# Patient Record
Sex: Male | Born: 1950 | Race: White | Hispanic: No | Marital: Married
Health system: Midwestern US, Academic
[De-identification: ages and names within clinical notes are randomized; demographics above are authoritative.]

---

## 2016-12-16 ENCOUNTER — Ambulatory Visit: Admit: 2016-12-16 | Discharge: 2016-12-17 | Payer: MEDICARE

## 2016-12-16 ENCOUNTER — Encounter: Admit: 2016-12-16 | Discharge: 2016-12-16

## 2016-12-16 DIAGNOSIS — R002 Palpitations: Principal | ICD-10-CM

## 2016-12-16 DIAGNOSIS — R091 Pleurisy: ICD-10-CM

## 2016-12-16 DIAGNOSIS — E7800 Pure hypercholesterolemia, unspecified: ICD-10-CM

## 2016-12-16 DIAGNOSIS — L719 Rosacea, unspecified: ICD-10-CM

## 2016-12-16 DIAGNOSIS — E785 Hyperlipidemia, unspecified: ICD-10-CM

## 2016-12-16 DIAGNOSIS — M791 Myalgia, unspecified site: ICD-10-CM

## 2016-12-16 DIAGNOSIS — J069 Acute upper respiratory infection, unspecified: ICD-10-CM

## 2016-12-16 NOTE — Progress Notes
Date of Service: 12/16/2016    Bryan Petty is a 66 y.o. male.       HPI     Mr. Bryan Petty is followed for palpitations and for hyperlipidemia. At one time he was taking Toprol XL for control of his palpitations, although in recent years his palpitations have been under good control and he has stopped taking his Toprol XL. The patient has a family history for premature coronary artery disease, in that his father had his first myocardial infarction at age 72. He has been taking simvastatin 20 mg every day without difficulty. Previously when he was taking atorvastatin, he developed some mild myalgias, although he has not developed myalgias with simvastatin. Over the past year, the patient has been doing well and he reports no angina, congestive symptoms, palpitations, sensation of sustained forceful heart pounding, lightheadedness or syncope. His exercise tolerance has been stable, and he has been exercising using a stationary bicycle for 30 minutes 3-4 days a week. 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  The patient reports no myalgias, bleeding abnormalities, neurologic motor abnormalities or difficulty with speech.        Vitals:    12/16/16 1523 12/16/16 1536   BP: 128/86 120/86   Pulse: 68    Weight: 100.3 kg (221 lb 3.2 oz)    Height: 1.803 m (5' 11)      Body mass index is 30.85 kg/m???.     Past Medical History  Patient Active Problem List    Diagnosis Date Noted   ??? Heart palpitations 04/16/2009     under good control with Toprol     ??? 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 Myalgia 04/16/2009     Developed some mild myalgias and reduced his Lipitor from 20 to 10 mg per day with resolution of his  symptoms         ??? Upper respiratory infection 04/16/2009   ??? Pleurisy hx of 04/16/2009   ??? Rosacea 04/16/2009         Review of Systems   Constitution: Negative.   HENT: Negative.    Eyes: Negative.    Cardiovascular: Negative. Respiratory: Negative.    Endocrine: Negative.    Hematologic/Lymphatic: Negative.    Skin: Negative.    Musculoskeletal: Negative.    Gastrointestinal: Negative.    Genitourinary: Negative.    Neurological: Negative.    Psychiatric/Behavioral: Negative.    Allergic/Immunologic: Negative.        Physical Exam  GENERAL: The patient is well developed, well nourished, resting comfortably and in no distress.   HEENT: No abnormalities of the visible oro-nasopharynx, conjunctiva or sclera are noted.  NECK: There is no jugular venous distension. Carotids are palpable and without bruits. There is no thyroid enlargement.  Chest: Lung fields are clear to auscultation. There are no wheezes or crackles.  CV: There is a regular rhythm. The first and second heart sounds are normal. There are no murmurs, gallops or rubs.  His apical heart rate is 68 bpm.  ABD: The abdomen is soft and supple with normal bowel sounds. There is no hepatosplenomegaly, ascites, tenderness, masses or bruits.  Neuro: There are no focal motor defects. Ambulation is normal. Cognitive function appears normal.  Ext: There is no edema or evidence of deep vein thrombosis. Peripheral pulses are satisfactory.    SKIN: There are no rashes and no cellulitis  PSYCH: The patient is calm, rationale  and oriented    Cardiovascular Studies  A twelve-lead ECG was obtained on 12/16/2016 and shows normal sinus rhythm with a heart rate of 60 bpm.  There is borderline left atrial abnormality.  There is no evidence of myocardial ischemia or infarction.    Labs obtained on 11/04/2016 showed total cholesterol 125, triglycerides 89, HDL 46 and LDL 68 mg/dL.  Problems Addressed Today  Hypercholesterolemia.  Palpitations.  Assessment and Plan     [name] is doing well from a cardiovascular perspective. None of his medications were changed today. He reports no angina or palpitations and his blood pressure and lipid profile are under very good control. Regular mild, aerobic exercise, weight loss and adherence to a heart healthy diet were recommended. I have asked him to return for follow-up in 12 months.         Current Medications (including today's revisions)  ??? aspirin EC 81 mg tablet Take 81 mg by mouth daily. Take with food.   ??? metoprolol XL (TOPROL XL) 25 mg extended release tablet Take 1 Tab by mouth daily. (Patient taking differently: Take 25 mg by mouth as Needed.)   ??? simvastatin (ZOCOR) 20 mg tablet Take 1 tablet by mouth at bedtime daily.

## 2016-12-19 ENCOUNTER — Encounter: Admit: 2016-12-19 | Discharge: 2016-12-19

## 2017-08-14 ENCOUNTER — Encounter: Admit: 2017-08-14 | Discharge: 2017-08-14

## 2017-08-27 ENCOUNTER — Encounter: Admit: 2017-08-27 | Discharge: 2017-08-27

## 2017-08-27 ENCOUNTER — Ambulatory Visit: Admit: 2017-08-27 | Discharge: 2017-08-28 | Payer: MEDICARE

## 2017-08-27 DIAGNOSIS — R091 Pleurisy: ICD-10-CM

## 2017-08-27 DIAGNOSIS — E785 Hyperlipidemia, unspecified: ICD-10-CM

## 2017-08-27 DIAGNOSIS — R002 Palpitations: Principal | ICD-10-CM

## 2017-08-27 DIAGNOSIS — J069 Acute upper respiratory infection, unspecified: ICD-10-CM

## 2017-08-27 DIAGNOSIS — E7800 Pure hypercholesterolemia, unspecified: ICD-10-CM

## 2017-08-27 DIAGNOSIS — M791 Myalgia, unspecified site: ICD-10-CM

## 2017-08-27 DIAGNOSIS — L719 Rosacea, unspecified: ICD-10-CM

## 2017-09-04 ENCOUNTER — Ambulatory Visit: Admit: 2017-09-04 | Discharge: 2017-09-05 | Payer: MEDICARE

## 2017-09-04 ENCOUNTER — Ambulatory Visit: Admit: 2017-09-04 | Discharge: 2017-09-04 | Payer: MEDICARE

## 2017-09-04 DIAGNOSIS — R002 Palpitations: Principal | ICD-10-CM

## 2017-09-04 MED ORDER — PERFLUTREN LIPID MICROSPHERES 1.1 MG/ML IV SUSP
1-20 mL | Freq: Once | INTRAVENOUS | 0 refills | Status: CP | PRN
Start: 2017-09-04 — End: ?

## 2017-09-07 ENCOUNTER — Encounter: Admit: 2017-09-07 | Discharge: 2017-09-07

## 2017-09-07 DIAGNOSIS — R9439 Abnormal result of other cardiovascular function study: Principal | ICD-10-CM

## 2017-09-07 DIAGNOSIS — R002 Palpitations: Principal | ICD-10-CM

## 2017-09-07 MED ORDER — LIDOCAINE (PF) 10 MG/ML (1 %) IJ SOLN
.1-2 mL | INTRAMUSCULAR | 0 refills | Status: CN | PRN
Start: 2017-09-07 — End: ?

## 2017-09-07 MED ORDER — SODIUM CHLORIDE 0.9 % IV SOLP
250 mL | INTRAVENOUS | 0 refills | Status: CN | PRN
Start: 2017-09-07 — End: ?

## 2017-09-08 ENCOUNTER — Encounter: Admit: 2017-09-08 | Discharge: 2017-09-08

## 2017-09-08 ENCOUNTER — Encounter: Admit: 2017-09-16 | Discharge: 2017-09-16 | Payer: MEDICARE

## 2017-09-08 DIAGNOSIS — R9439 Abnormal result of other cardiovascular function study: Principal | ICD-10-CM

## 2017-09-11 ENCOUNTER — Encounter: Admit: 2017-09-11 | Discharge: 2017-09-11

## 2017-09-11 DIAGNOSIS — R9439 Abnormal result of other cardiovascular function study: Principal | ICD-10-CM

## 2017-09-11 MED ORDER — MAGNESIUM HYDROXIDE 2,400 MG/10 ML PO SUSP
10 mL | ORAL | 0 refills | Status: CN | PRN
Start: 2017-09-11 — End: ?

## 2017-09-11 MED ORDER — ACETAMINOPHEN 325 MG PO TAB
650 mg | ORAL | 0 refills | Status: CN | PRN
Start: 2017-09-11 — End: ?

## 2017-09-11 MED ORDER — TEMAZEPAM 15 MG PO CAP
15 mg | Freq: Every evening | ORAL | 0 refills | Status: CN | PRN
Start: 2017-09-11 — End: ?

## 2017-09-11 MED ORDER — ALUMINUM-MAGNESIUM HYDROXIDE 200-200 MG/5 ML PO SUSP
30 mL | ORAL | 0 refills | Status: CN | PRN
Start: 2017-09-11 — End: ?

## 2017-09-12 LAB — BASIC METABOLIC PANEL
Lab: 1
Lab: 109 — ABNORMAL HIGH (ref 98–107)
Lab: 12
Lab: 141
Lab: 16 — ABNORMAL HIGH (ref 27.0–31.0)
Lab: 24
Lab: 4.3
Lab: 9.5
Lab: 99

## 2017-09-12 LAB — CBC: Lab: 5.2

## 2017-09-15 ENCOUNTER — Encounter: Admit: 2017-09-15 | Discharge: 2017-09-15

## 2017-09-15 DIAGNOSIS — R002 Palpitations: Principal | ICD-10-CM

## 2017-09-15 DIAGNOSIS — R9439 Abnormal result of other cardiovascular function study: ICD-10-CM

## 2017-09-16 ENCOUNTER — Ambulatory Visit: Admit: 2017-09-16 | Discharge: 2017-09-16 | Payer: MEDICARE

## 2017-09-16 ENCOUNTER — Encounter: Admit: 2017-09-16 | Discharge: 2017-09-16

## 2017-09-16 DIAGNOSIS — I2510 Atherosclerotic heart disease of native coronary artery without angina pectoris: ICD-10-CM

## 2017-09-16 DIAGNOSIS — I4510 Unspecified right bundle-branch block: ICD-10-CM

## 2017-09-16 DIAGNOSIS — E782 Mixed hyperlipidemia: ICD-10-CM

## 2017-09-16 DIAGNOSIS — I2583 Coronary atherosclerosis due to lipid rich plaque: ICD-10-CM

## 2017-09-16 DIAGNOSIS — M791 Myalgia, unspecified site: ICD-10-CM

## 2017-09-16 DIAGNOSIS — J069 Acute upper respiratory infection, unspecified: ICD-10-CM

## 2017-09-16 DIAGNOSIS — R091 Pleurisy: ICD-10-CM

## 2017-09-16 DIAGNOSIS — R002 Palpitations: Principal | ICD-10-CM

## 2017-09-16 DIAGNOSIS — L719 Rosacea, unspecified: ICD-10-CM

## 2017-09-16 DIAGNOSIS — E785 Hyperlipidemia, unspecified: ICD-10-CM

## 2017-09-16 MED ORDER — ALUMINUM-MAGNESIUM HYDROXIDE 200-200 MG/5 ML PO SUSP
30 mL | ORAL | 0 refills | Status: DC | PRN
Start: 2017-09-16 — End: 2017-09-16

## 2017-09-16 MED ORDER — LIDOCAINE (PF) 10 MG/ML (1 %) IJ SOLN
.1-2 mL | INTRAMUSCULAR | 0 refills | Status: DC | PRN
Start: 2017-09-16 — End: 2017-09-16

## 2017-09-16 MED ORDER — DIPHENHYDRAMINE HCL 50 MG/ML IJ SOLN
25 mg | INTRAVENOUS | 0 refills | Status: DC | PRN
Start: 2017-09-16 — End: 2017-09-16

## 2017-09-16 MED ORDER — SODIUM CHLORIDE 0.9 % IV SOLP
250 mL | INTRAVENOUS | 0 refills | Status: DC | PRN
Start: 2017-09-16 — End: 2017-09-16

## 2017-09-16 MED ORDER — MAGNESIUM HYDROXIDE 2,400 MG/10 ML PO SUSP
10 mL | ORAL | 0 refills | Status: DC | PRN
Start: 2017-09-16 — End: 2017-09-16

## 2017-09-16 MED ORDER — DIPHENHYDRAMINE HCL 25 MG PO CAP
25 mg | ORAL | 0 refills | Status: DC | PRN
Start: 2017-09-16 — End: 2017-09-16

## 2017-09-16 MED ORDER — ACETAMINOPHEN 325 MG PO TAB
650 mg | ORAL | 0 refills | Status: DC | PRN
Start: 2017-09-16 — End: 2017-09-16

## 2017-09-16 MED ORDER — TEMAZEPAM 15 MG PO CAP
15 mg | Freq: Every evening | ORAL | 0 refills | Status: DC | PRN
Start: 2017-09-16 — End: 2017-09-16

## 2017-09-16 MED ORDER — ONDANSETRON HCL (PF) 4 MG/2 ML IJ SOLN
4 mg | INTRAVENOUS | 0 refills | Status: DC | PRN
Start: 2017-09-16 — End: 2017-09-16

## 2017-09-22 ENCOUNTER — Encounter: Admit: 2017-09-22 | Discharge: 2017-09-22

## 2017-11-05 ENCOUNTER — Encounter: Admit: 2017-11-05 | Discharge: 2017-11-05

## 2017-11-05 DIAGNOSIS — E782 Mixed hyperlipidemia: Principal | ICD-10-CM

## 2017-11-05 DIAGNOSIS — I2510 Atherosclerotic heart disease of native coronary artery without angina pectoris: ICD-10-CM

## 2019-10-23 IMAGING — MR Shoulder^ROUTINE
5 of 7 series · 26 of 40 positions shown · non-contrast
Comparison: none

[Series 4: PD · oblique · 4.0mm · 0.55mm/px · 6 of 18 slices shown]
[im 1/18]
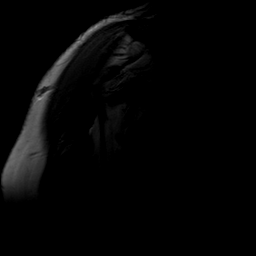
[im 4/18]
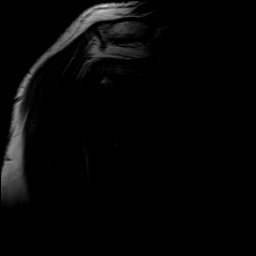
[im 7/18]
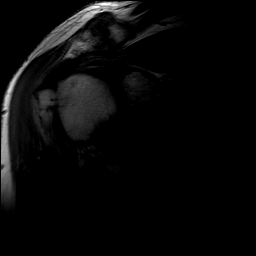
[im 11/18]
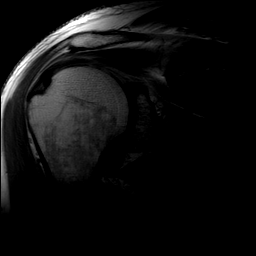
[im 14/18]
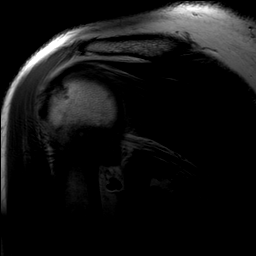
[im 18/18]
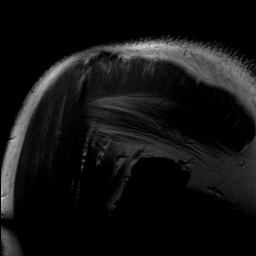

[Series 5: T2 fat-sat · oblique · 4.0mm · 0.27mm/px · 5 of 18 slices shown (1 of 2)]
[im 1/18]
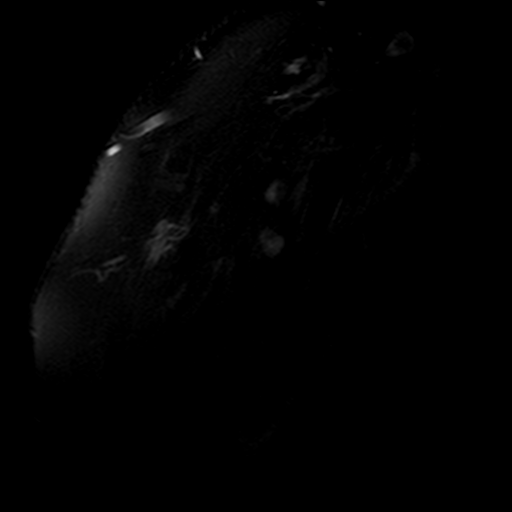
[im 5/18]
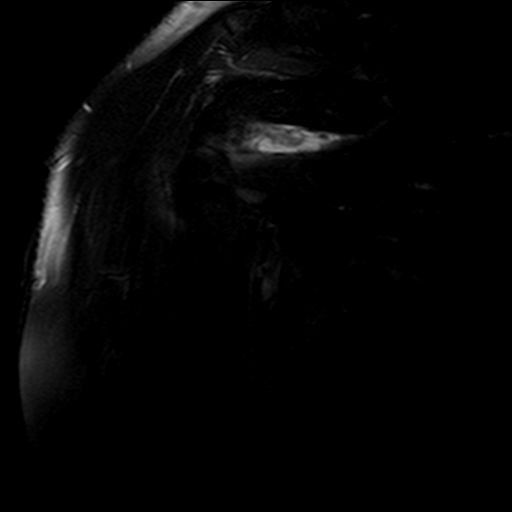
[im 9/18]
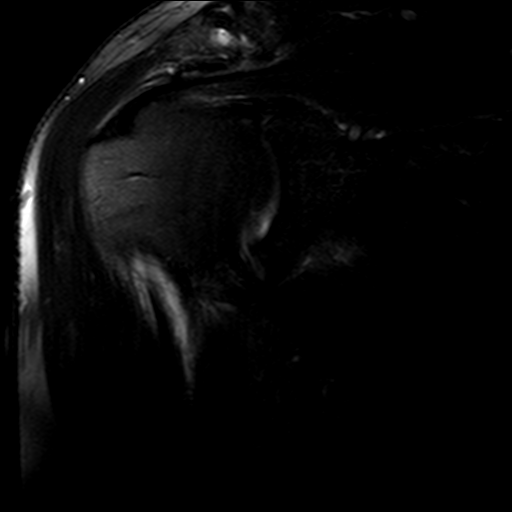
[im 13/18]
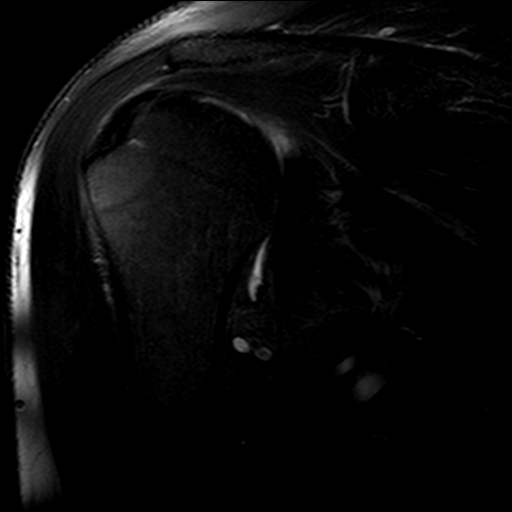
[im 18/18]
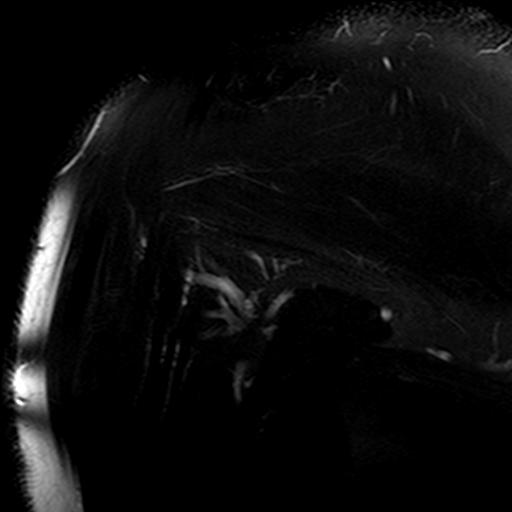

[Series 6: T1 · oblique · 4.0mm · 0.44mm/px · 6 of 22 slices shown]
[im 1/22]
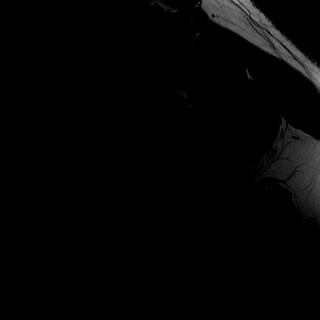
[im 5/22]
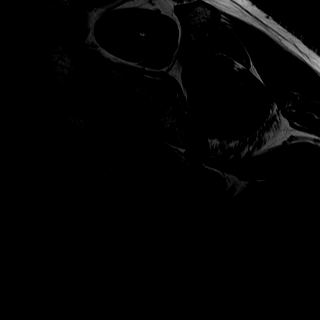
[im 9/22]
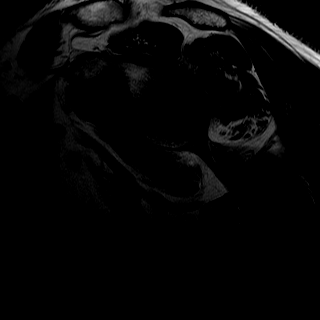
[im 13/22]
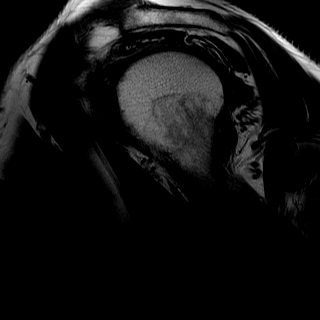
[im 17/22]
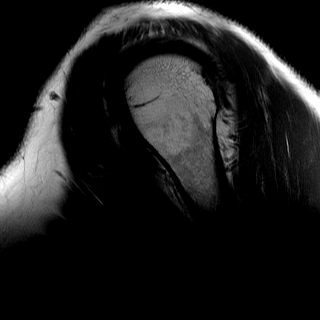
[im 22/22]
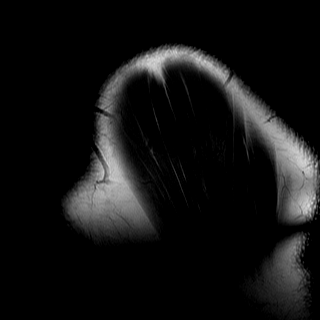

[Series 7: T2 fat-sat · axial · 4.0mm · 0.35mm/px · z∈[-44,+58]mm · 6 of 23 slices shown (2 of 2)]
[im 1/23]
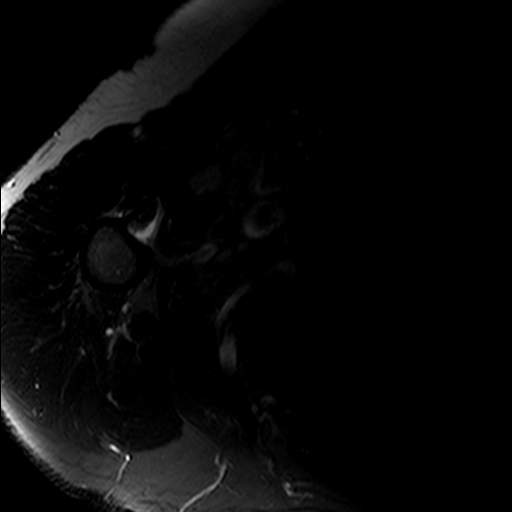
[im 5/23]
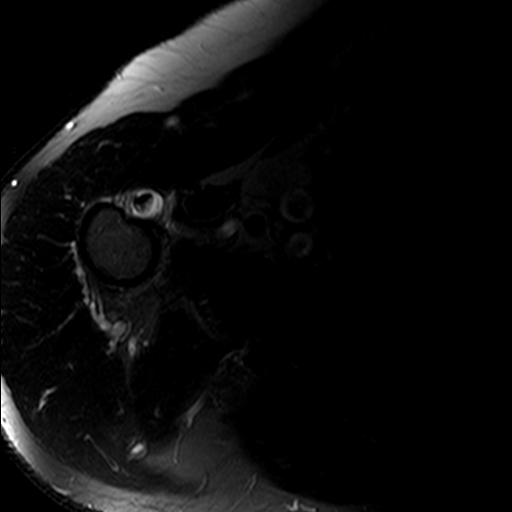
[im 9/23]
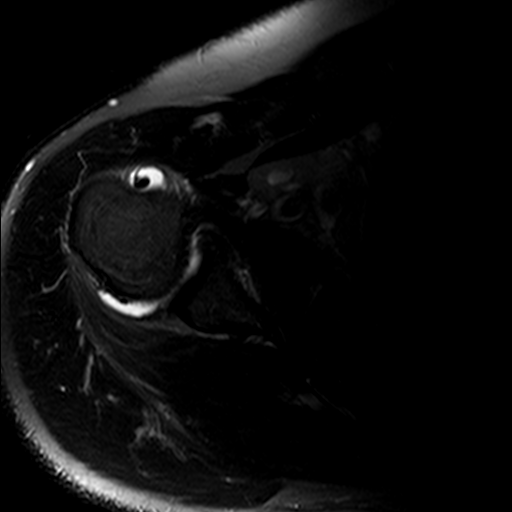
[im 14/23]
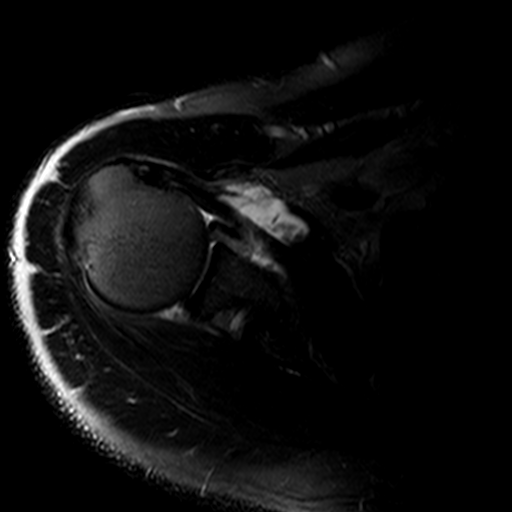
[im 18/23]
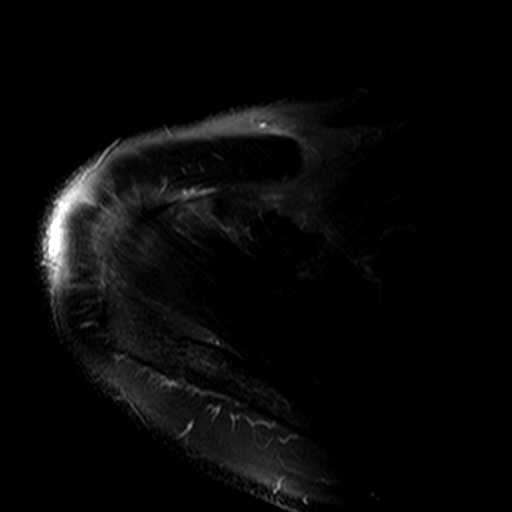
[im 23/23]
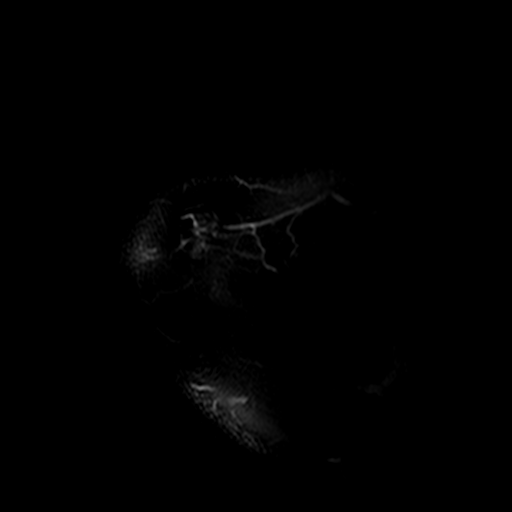

[Series 8: STIR · oblique · 4.0mm · 0.27mm/px · 3 of 22 slices shown]
[im 1/22]
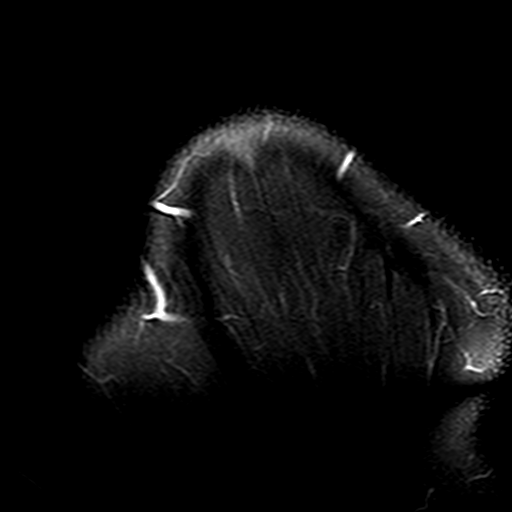
[im 5/22]
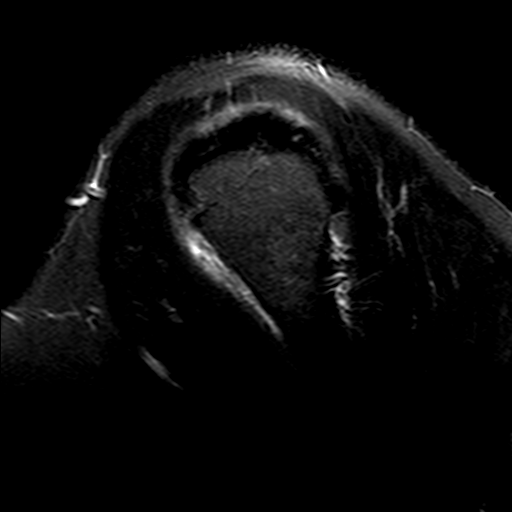
[im 9/22]
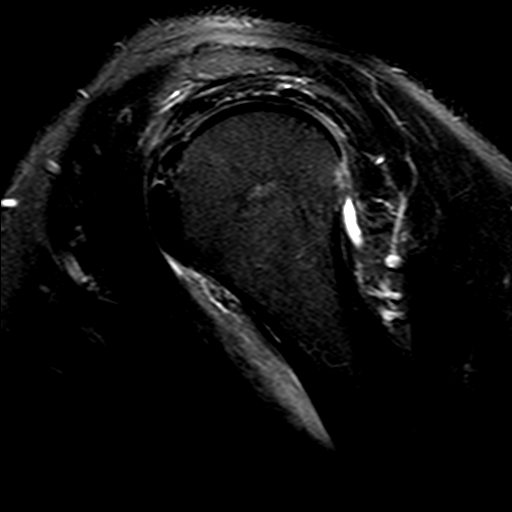

[26 of 40 positions shown; findings below may reference images not displayed]

MRI REPORT

DIAGNOSTIC STUDIES

EXAM

MAGNETIC RESONANCE IMAGING, UPPER EXTREMITY, ANY JOINT OF UPPER EXTREMITY; RIGHT SHOULDER WITHOUT
CONTRAST MATERIAL(S); CPT 28112

INDICATION

Right shoulder pain. No known injury, Pain when turning arm into a pronation position. BG

TECHNIQUE

Multiplanar, multi-echo sequences were generated through the right shoulder utilizing a 1.5 tesla
magnet.

COMPARISONS

Plain films of the right shoulder dated 07/22/2017.

FINDINGS

ROTATOR CUFF, LIGAMENTS, TENDONS, AND MUSCLES:

There is a small insertional tear of the distal infra spinatus tendon into the footprint measuring
5 x 5 mm. Findings are best seen on coronal series 9 image 12 and corresponding sagittal series 8

The supraspinatus, teres minor, and subscapularis tendons and muscles are intact. The long head of
the biceps tendon is intact. Fluid in the biceps tendon sheath is identified.

GLENOHUMERAL JOINT:

[The joint is normally aligned.] [The anterior and posterior labrum are intact.] [The articular
cartilage is intact.] There is a mild joint effusion.

ACROMIOCLAVICULAR JOINT:

[The joint is normally aligned.] [There are moderate degenerative changes of the acromioclavicular
joint with prominent subacromial spurring. Clinical correlation for shoulder impingement is advised.
]

BONE:

The bones all have normal configuration. The bone marrow signal is within normal limits.
Specifically, negative for fracture, osteomyelitis, osteonecrosis, or marrow replacing process.

BURSAE AND SOFT TISSUES:

[The bursae and soft tissues surrounding the shoulder are unremarkable.]

IMPRESSION

5 x 5 mm insertional tear of the distal infraspinatus tendon into the footprint. Mild joint
effusion. Fluid in the biceps tendon sheath. Moderately severe degenerative changes of the
acromioclavicular joint with prominent subacromial spurring. Clinical correlation for shoulder
impingement is advised.
